# Patient Record
Sex: Female | Born: 1970 | Race: Black or African American | Hispanic: No | Marital: Single | Smoking: Current every day smoker
Health system: Southern US, Community
[De-identification: ages and names within clinical notes are randomized; demographics above are authoritative.]

## PROBLEM LIST (undated history)

## (undated) DIAGNOSIS — E119 Type 2 diabetes mellitus without complications: Secondary | ICD-10-CM

## (undated) HISTORY — PX: ABDOMINAL HYSTERECTOMY: SHX81

---

## 2003-06-22 DIAGNOSIS — D869 Sarcoidosis, unspecified: Secondary | ICD-10-CM | POA: Insufficient documentation

## 2006-08-10 ENCOUNTER — Emergency Department: Payer: Self-pay | Admitting: Emergency Medicine

## 2007-01-13 ENCOUNTER — Other Ambulatory Visit: Payer: Self-pay

## 2007-01-14 ENCOUNTER — Inpatient Hospital Stay: Payer: Self-pay | Admitting: Internal Medicine

## 2007-01-19 ENCOUNTER — Ambulatory Visit: Payer: Self-pay | Admitting: Vascular Surgery

## 2007-07-09 ENCOUNTER — Ambulatory Visit: Payer: Self-pay | Admitting: Internal Medicine

## 2007-07-12 ENCOUNTER — Ambulatory Visit: Payer: Self-pay | Admitting: Internal Medicine

## 2008-05-16 ENCOUNTER — Emergency Department: Payer: Self-pay | Admitting: Emergency Medicine

## 2008-10-29 ENCOUNTER — Ambulatory Visit: Payer: Self-pay | Admitting: Family Medicine

## 2009-09-17 ENCOUNTER — Emergency Department: Payer: Self-pay | Admitting: Emergency Medicine

## 2010-06-26 IMAGING — CR DG CHEST 1V PORT
1 series · 1 of 1 positions shown · non-contrast
Comparison: none

REASON FOR EXAM: chest pain
COMMENTS:

PROCEDURE:     DXR - DXR PORTABLE CHEST SINGLE VIEW  - September 17, 2009  [DATE]
RESULT:     The lungs are hypoinflated. The cardiac silhouette is at the
upper limits of normal for size. The pulmonary vascularity is not engorged.
There is no pleural effusion.

[view not recorded]
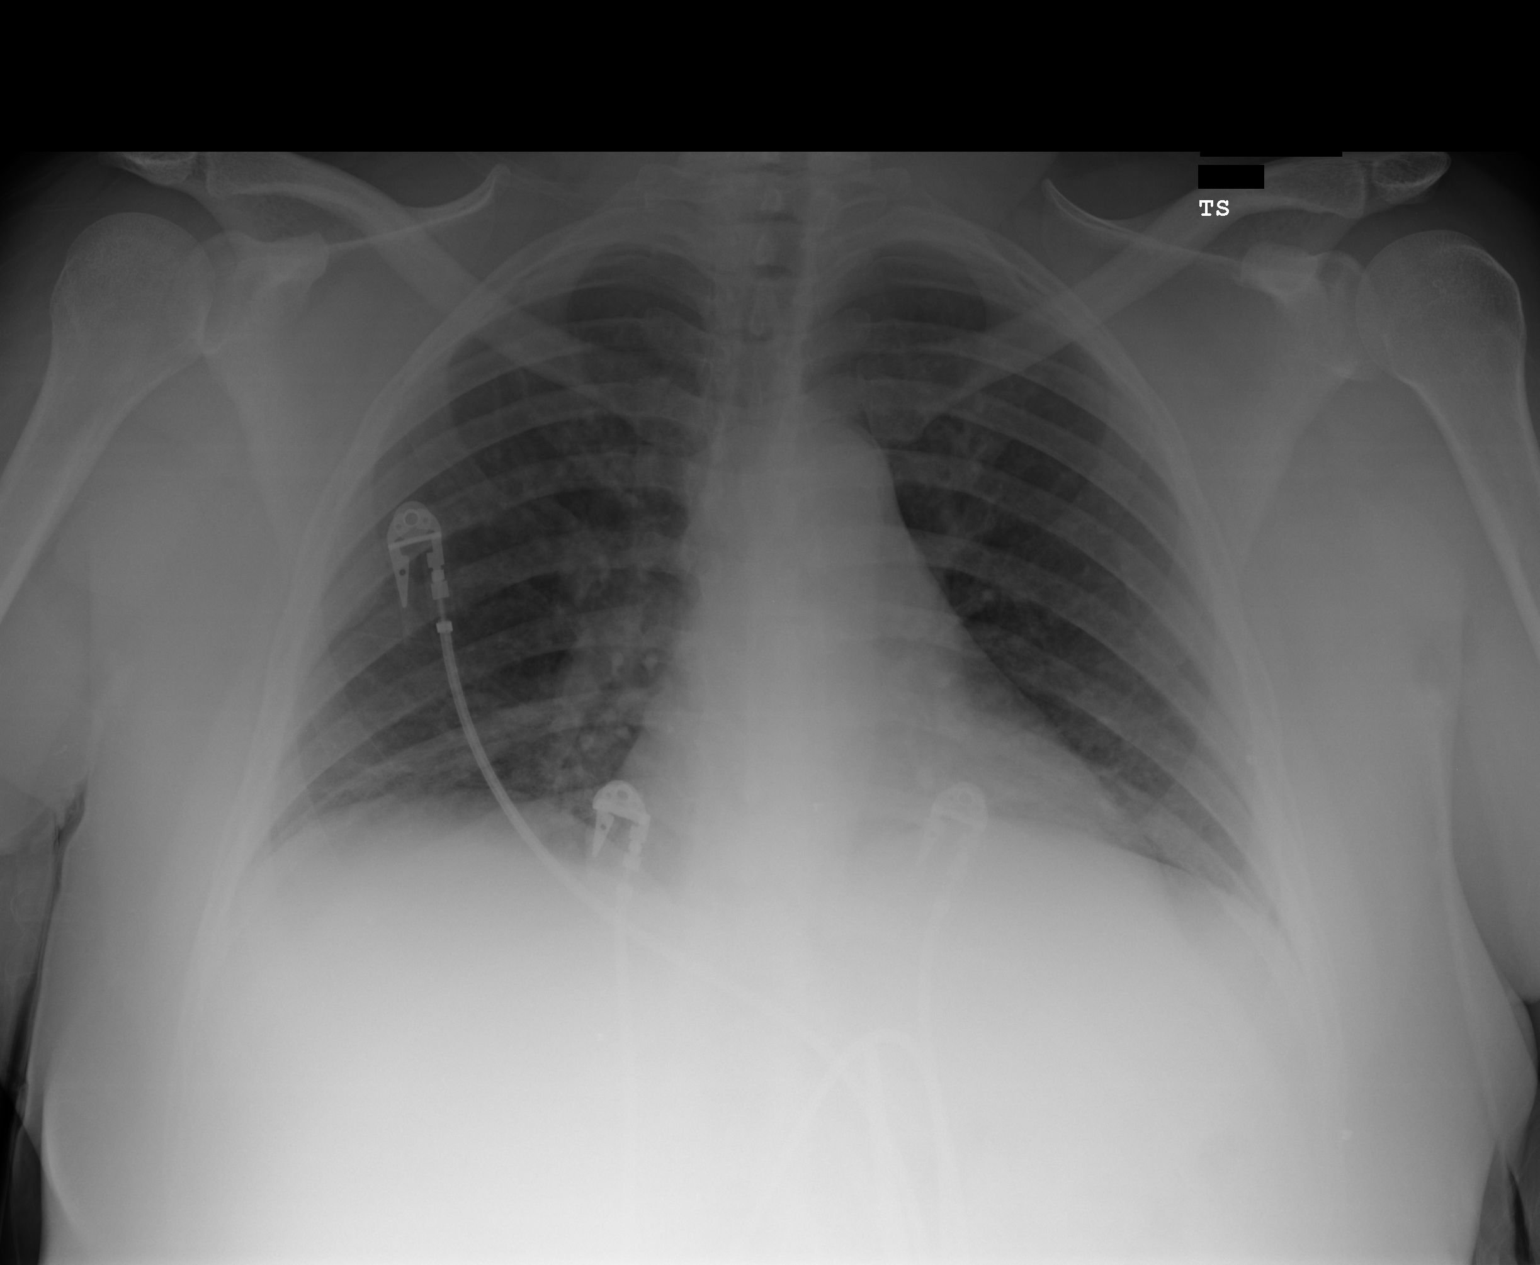

[1 of 1 positions shown; findings below may reference images not displayed]

IMPRESSION: There is mild pulmonary hypoinflation. I do not see
evidence of CHF nor of pneumonia.

## 2012-01-15 ENCOUNTER — Emergency Department: Payer: Self-pay | Admitting: Emergency Medicine

## 2012-01-17 LAB — BETA STREP CULTURE(ARMC)

## 2012-05-03 DIAGNOSIS — F17210 Nicotine dependence, cigarettes, uncomplicated: Secondary | ICD-10-CM | POA: Insufficient documentation

## 2012-05-03 DIAGNOSIS — E119 Type 2 diabetes mellitus without complications: Secondary | ICD-10-CM | POA: Insufficient documentation

## 2012-05-03 DIAGNOSIS — E1142 Type 2 diabetes mellitus with diabetic polyneuropathy: Secondary | ICD-10-CM | POA: Insufficient documentation

## 2012-05-03 DIAGNOSIS — K116 Mucocele of salivary gland: Secondary | ICD-10-CM | POA: Insufficient documentation

## 2012-05-03 DIAGNOSIS — I10 Essential (primary) hypertension: Secondary | ICD-10-CM | POA: Insufficient documentation

## 2012-05-04 DIAGNOSIS — E785 Hyperlipidemia, unspecified: Secondary | ICD-10-CM | POA: Insufficient documentation

## 2012-05-04 DIAGNOSIS — E1169 Type 2 diabetes mellitus with other specified complication: Secondary | ICD-10-CM | POA: Insufficient documentation

## 2012-06-22 DIAGNOSIS — K219 Gastro-esophageal reflux disease without esophagitis: Secondary | ICD-10-CM | POA: Insufficient documentation

## 2012-06-22 DIAGNOSIS — T50905A Adverse effect of unspecified drugs, medicaments and biological substances, initial encounter: Secondary | ICD-10-CM | POA: Insufficient documentation

## 2012-06-27 ENCOUNTER — Emergency Department: Payer: Self-pay | Admitting: Emergency Medicine

## 2012-06-27 LAB — COMPREHENSIVE METABOLIC PANEL
Alkaline Phosphatase: 201 U/L — ABNORMAL HIGH (ref 50–136)
Bilirubin,Total: 0.4 mg/dL (ref 0.2–1.0)
Calcium, Total: 9.1 mg/dL (ref 8.5–10.1)
Chloride: 105 mmol/L (ref 98–107)
Co2: 23 mmol/L (ref 21–32)
Creatinine: 0.95 mg/dL (ref 0.60–1.30)
EGFR (African American): >60
EGFR (Non-African Amer.): >60
Osmolality: 280 (ref 275–301)
SGPT (ALT): 136 U/L — ABNORMAL HIGH
Total Protein: 9 g/dL — ABNORMAL HIGH (ref 6.4–8.2)

## 2012-06-27 LAB — CBC
HCT: 41.3 % (ref 35.0–47.0)
MCH: 29.5 pg (ref 26.0–34.0)
MCHC: 32.8 g/dL (ref 32.0–36.0)
MCV: 90 fL (ref 80–100)
Platelet: 172 10*3/uL (ref 150–440)
RBC: 4.59 10*6/uL (ref 3.80–5.20)
RDW: 13.2 % (ref 11.5–14.5)

## 2012-06-27 LAB — TROPONIN I: Troponin-I: <0.02 ng/mL

## 2013-01-12 ENCOUNTER — Emergency Department: Payer: Self-pay | Admitting: Emergency Medicine

## 2015-03-12 ENCOUNTER — Emergency Department
Admit: 2015-03-12 | Disposition: A | Discharge status: Home / Self Care | Payer: Self-pay | Admitting: Emergency Medicine

## 2015-03-12 LAB — CBC WITH DIFFERENTIAL/PLATELET
BASOS ABS: 0.1 10*3/uL (ref 0.0–0.1)
Basophil %: 0.8 %
EOS PCT: 4.8 %
Eosinophil #: 0.5 10*3/uL (ref 0.0–0.7)
HCT: 41.9 % (ref 35.0–47.0)
HGB: 14.1 g/dL (ref 12.0–16.0)
LYMPHS ABS: 4.2 10*3/uL — AB (ref 1.0–3.6)
Lymphocyte %: 43.8 %
MCH: 30.2 pg (ref 26.0–34.0)
MCHC: 33.7 g/dL (ref 32.0–36.0)
MCV: 90 fL (ref 80–100)
Monocyte #: 0.7 x10 3/mm (ref 0.2–0.9)
Monocyte %: 7.6 %
NEUTROS PCT: 43 %
Neutrophil #: 4.1 10*3/uL (ref 1.4–6.5)
Platelet: 192 10*3/uL (ref 150–440)
RBC: 4.67 10*6/uL (ref 3.80–5.20)
RDW: 13.5 % (ref 11.5–14.5)
WBC: 9.6 10*3/uL (ref 3.6–11.0)

## 2015-03-12 LAB — COMPREHENSIVE METABOLIC PANEL
ALK PHOS: 113 U/L
Albumin: 4.3 g/dL
Anion Gap: 9 (ref 7–16)
BILIRUBIN TOTAL: 0.5 mg/dL
BUN: 18 mg/dL
CALCIUM: 9.8 mg/dL
CHLORIDE: 101 mmol/L
CO2: 26 mmol/L
CREATININE: 0.82 mg/dL
EGFR (African American): >60
EGFR (Non-African Amer.): >60
Glucose: 116 mg/dL — ABNORMAL HIGH
POTASSIUM: 3.7 mmol/L
SGOT(AST): 33 U/L
SGPT (ALT): 40 U/L
SODIUM: 136 mmol/L
Total Protein: 8.5 g/dL — ABNORMAL HIGH

## 2015-03-12 LAB — TROPONIN I

## 2015-05-08 DIAGNOSIS — Z720 Tobacco use: Secondary | ICD-10-CM | POA: Insufficient documentation

## 2019-11-08 ENCOUNTER — Emergency Department
Admission: EM | Admit: 2019-11-08 | Discharge: 2019-11-08 | Disposition: A | Discharge status: Home / Self Care | Payer: Self-pay | Attending: Emergency Medicine | Admitting: Emergency Medicine

## 2019-11-08 ENCOUNTER — Emergency Department: Payer: Self-pay

## 2019-11-08 ENCOUNTER — Encounter: Payer: Self-pay | Admitting: Emergency Medicine

## 2019-11-08 ENCOUNTER — Other Ambulatory Visit: Payer: Self-pay

## 2019-11-08 DIAGNOSIS — F17210 Nicotine dependence, cigarettes, uncomplicated: Secondary | ICD-10-CM | POA: Insufficient documentation

## 2019-11-08 DIAGNOSIS — R030 Elevated blood-pressure reading, without diagnosis of hypertension: Secondary | ICD-10-CM

## 2019-11-08 DIAGNOSIS — R7309 Other abnormal glucose: Secondary | ICD-10-CM

## 2019-11-08 DIAGNOSIS — E1165 Type 2 diabetes mellitus with hyperglycemia: Secondary | ICD-10-CM | POA: Insufficient documentation

## 2019-11-08 DIAGNOSIS — R21 Rash and other nonspecific skin eruption: Secondary | ICD-10-CM

## 2019-11-08 DIAGNOSIS — R590 Localized enlarged lymph nodes: Secondary | ICD-10-CM

## 2019-11-08 DIAGNOSIS — R609 Edema, unspecified: Secondary | ICD-10-CM

## 2019-11-08 HISTORY — DX: Type 2 diabetes mellitus without complications: E11.9

## 2019-11-08 LAB — CBC WITH DIFFERENTIAL/PLATELET
Abs Immature Granulocytes: 0.02 10*3/uL (ref 0.00–0.07)
Basophils Absolute: 0.1 10*3/uL (ref 0.0–0.1)
Basophils Relative: 1 %
Eosinophils Absolute: 0.4 10*3/uL (ref 0.0–0.5)
Eosinophils Relative: 6 %
HCT: 40.8 % (ref 36.0–46.0)
Hemoglobin: 14 g/dL (ref 12.0–15.0)
Immature Granulocytes: 0 %
Lymphocytes Relative: 42 %
Lymphs Abs: 2.4 10*3/uL (ref 0.7–4.0)
MCH: 29.4 pg (ref 26.0–34.0)
MCHC: 34.3 g/dL (ref 30.0–36.0)
MCV: 85.7 fL (ref 80.0–100.0)
Monocytes Absolute: 0.6 10*3/uL (ref 0.1–1.0)
Monocytes Relative: 10 %
Neutro Abs: 2.3 10*3/uL (ref 1.7–7.7)
Neutrophils Relative %: 41 %
Platelets: 173 10*3/uL (ref 150–400)
RBC: 4.76 MIL/uL (ref 3.87–5.11)
RDW: 12 % (ref 11.5–15.5)
WBC: 5.7 10*3/uL (ref 4.0–10.5)
nRBC: 0 % (ref 0.0–0.2)

## 2019-11-08 LAB — BASIC METABOLIC PANEL
Anion gap: 10 (ref 5–15)
BUN: 12 mg/dL (ref 6–20)
CO2: 24 mmol/L (ref 22–32)
Calcium: 9.6 mg/dL (ref 8.9–10.3)
Chloride: 103 mmol/L (ref 98–111)
Creatinine, Ser: 0.87 mg/dL (ref 0.44–1.00)
GFR calc Af Amer: >60 mL/min (ref >60)
GFR calc non Af Amer: >60 mL/min (ref >60)
Glucose, Bld: 197 mg/dL — ABNORMAL HIGH (ref 70–99)
Potassium: 4.7 mmol/L (ref 3.5–5.1)
Sodium: 137 mmol/L (ref 135–145)

## 2019-11-08 LAB — GLUCOSE, CAPILLARY: Glucose-Capillary: 243 mg/dL — ABNORMAL HIGH (ref 70–99)

## 2019-11-08 MED ORDER — IOHEXOL 300 MG/ML  SOLN
75.0000 mL | Freq: Once | INTRAMUSCULAR | Status: AC | PRN
  Administered 2019-11-08: 75 mL via INTRAVENOUS
  Filled 2019-11-08: qty 75

## 2019-11-08 MED ORDER — METHYLPREDNISOLONE 4 MG PO TBPK: ORAL_TABLET | ORAL | 0 refills | Status: DC

## 2019-11-08 MED ORDER — HYDROCORTISONE VALERATE 0.2 % EX OINT: TOPICAL_OINTMENT | CUTANEOUS | 1 refills | Status: AC

## 2019-11-08 MED ORDER — HYDROXYZINE HCL 50 MG PO TABS
50.0000 mg | ORAL_TABLET | Freq: Once | ORAL | Status: AC
  Administered 2019-11-08: 50 mg via ORAL
  Filled 2019-11-08: qty 1

## 2019-11-08 MED ORDER — HYDROXYZINE HCL 50 MG PO TABS: 50.0000 mg | ORAL_TABLET | Freq: Three times a day (TID) | ORAL | 0 refills | Status: DC | PRN

## 2019-11-08 NOTE — ED Provider Notes (Signed)
[facility] Emergency Department Provider Note   ____________________________________________   First MD Initiated Contact with Patient 11/08/19 1045     (approximate)  I have reviewed the triage vital signs and the nursing notes.   HISTORY  Chief Complaint Rash    HPI Molly Salinas is a 48 y.o. female patient presents with facial rash which started last night.  Patient state rash started on headache  And it spread to the nasal area.  Rash was itching but is now under control after taking 25 mg of Benadryl approximately 2 hours ago.  Patient also has right peri-auricle edema upon a.m. awakening.  Patient stated area is tender to palpation.  Patient rates the pain as a 5/10.  Patient described the pain as "sore".  Patient denies fever/ chills associated with complaint.  Patient denies URI signs and symptoms.  Patient denies hearing loss.     Past Medical History:  Diagnosis Date  . Diabetes mellitus without complication (HCC)     There are no active problems to display for this patient.   Past Surgical History:  Procedure Laterality Date  . ABDOMINAL HYSTERECTOMY      Prior to Admission medications   Medication Sig Start Date End Date Taking? Authorizing Provider  hydrocortisone valerate ointment (WESTCORT) 0.2 % Apply to affected area daily 11/08/19 11/07/20  Joni ReiningSmith, Ronald K, PA-C  hydrOXYzine (ATARAX/VISTARIL) 50 MG tablet Take 1 tablet (50 mg total) by mouth 3 (three) times daily as needed for itching. 11/08/19   Joni ReiningSmith, Ronald K, PA-C  methylPREDNISolone (MEDROL DOSEPAK) 4 MG TBPK tablet Take Tapered dose as directed 11/08/19   Joni ReiningSmith, Ronald K, PA-C    Allergies Patient has no known allergies.  No family history on file.  Social History Social History   Tobacco Use  . Smoking status: Current Every Day Smoker  . Smokeless tobacco: Never Used  Substance Use Topics  . Alcohol use: Not on file  . Drug use: Not on file    Review  of Systems Constitutional: No fever/chills Eyes: No visual changes. ENT: No sore throat. Cardiovascular: Denies chest pain. Respiratory: Denies shortness of breath. Gastrointestinal: No abdominal pain.  No nausea, no vomiting.  No diarrhea.  No constipation. Genitourinary: Negative for dysuria. Musculoskeletal: Negative for back pain. Skin: Negative for rash. Neurological: Negative for headaches, focal weakness or numbness. Endocrine:  Diabetes and hypertension.   ____________________________________________   PHYSICAL EXAM:  VITAL SIGNS: ED Triage Vitals  Enc Vitals Group     BP 11/08/19 1050 (!) 170/98     Pulse Rate 11/08/19 1050 78     Resp 11/08/19 1050 18     Temp 11/08/19 1050 98.6 F (37 C)     Temp Source 11/08/19 1050 Oral     SpO2 11/08/19 1050 100 %     Weight 11/08/19 1051 180 lb (81.6 kg)     Height 11/08/19 1051 5\' 4"  (1.626 m)     Head Circumference --      Peak Flow --      Pain Score 11/08/19 1051 5     Pain Loc --      Pain Edu? --      Excl. in GC? --    Constitutional: Alert and oriented. Well appearing and in no acute distress. Eyes: Conjunctivae are normal. PERRL. EOMI. Head: Atraumatic. Nose: No congestion/rhinnorhea. Mouth/Throat: Mucous membranes are moist.  Oropharynx non-erythematous. Neck: No stridor.   Hematological/Lymphatic/Immunilogical: Submandibular/parotid edema. Cardiovascular: Normal rate, regular rhythm. Grossly  normal heart sounds.  Good peripheral circulation. Respiratory: Normal respiratory effort.  No retractions. Lungs CTAB. Gastrointestinal: Soft and nontender. No distention. No abdominal bruits. No CVA tenderness. Neurologic:  Normal speech and language. No gross focal neurologic deficits are appreciated. No gait instability. Skin:  Skin is warm, dry and intact.  Papulovesicular lesions central forehead. Psychiatric: Mood and affect are normal. Speech and behavior are normal.   ____________________________________________   LABS (all labs ordered are listed, but only abnormal results are displayed)  Labs Reviewed  GLUCOSE, CAPILLARY - Abnormal; Notable for the following components:      Result Value   Glucose-Capillary 243 (*)    All other components within normal limits  BASIC METABOLIC PANEL - Abnormal; Notable for the following components:   Glucose, Bld 197 (*)    All other components within normal limits  CBC WITH DIFFERENTIAL/PLATELET  CBG MONITORING, ED   ____________________________________________  EKG   ____________________________________________  RADIOLOGY  ED MD interpretation:    Official radiology report(s): Ct Soft Tissue Neck W Contrast  Result Date: 11/08/2019 CLINICAL DATA:  Facial rash. Forehead swelling. EXAM: CT NECK WITH CONTRAST TECHNIQUE: Multidetector CT imaging of the neck was performed using the standard protocol following the bolus administration of intravenous contrast. CONTRAST:  63mL OMNIPAQUE IOHEXOL 300 MG/ML  SOLN COMPARISON:  Neck ultrasound 11/08/2019 FINDINGS: Pharynx and larynx: No evidence of mass or swelling. Patent airway. No fluid collection or inflammatory changes in the parapharyngeal or retropharyngeal spaces. Medial positioning of the left vocal cord with asymmetric enlargement of the left laryngeal ventricle and left piriform sinus. Salivary glands: Small soft tissue nodules within and adjacent to the parotid glands bilaterally, right greater than left and favored to reflect intraparotid and periparotid lymph nodes including superior intraparotid nodes measuring up to 12 x 8 mm on the right and 8 x 6 mm on the left. 16 x 10 mm periparotid lymph node along the posterior margin of the right parotid gland inferiorly. Symmetric prominent size of the parotid and submandibular glands bilaterally without parotid or submandibular space inflammation, salivary calculi, or ductal dilatation. Thyroid: Unremarkable. Lymph  nodes: Enlarged right level IIA lymph nodes measuring 1.3 and 1.4 cm in short axis. Abnormally increased number of rounded subcentimeter short axis lymph nodes in levels II and III bilaterally as well as on the right and level V. no cystic changes. Vascular: Major vascular structures of the neck are patent. Limited intracranial: Unremarkable. Visualized orbits: Unremarkable. Mastoids and visualized paranasal sinuses: Mild-to-moderate mucosal thickening involving the right sphenoid sinus and posterior right ethmoid air cells. No sinus fluid. Small left mastoid effusion. Skeleton: Bulky anterior vertebral ossification from C4-C7 compatible with diffuse idiopathic skeletal hyperostosis. Moderate facet arthrosis on the left at C2-3 and on the right at C7-T1. No suspicious osseous lesion. Upper chest: Clear lung apices. No superior mediastinal lymphadenopathy. Other: None. IMPRESSION: 1. Mild right greater than left cervical lymphadenopathy including mildly enlarged intraparotid/periparotid lymph nodes bilaterally. The appearance is nonspecific, and while a reactive/inflammatory etiology is favored, close clinical follow-up is recommended as lymphoproliferative disease is not excluded. 2. No primary neck mass identified. 3. Possible left vocal cord paresis. Electronically Signed   By: Logan Bores M.D.   On: 11/08/2019 14:41   US Soft Tissue Head & Neck (non-thyroid)  Result Date: 11/08/2019 CLINICAL DATA:  Edema. Additional history provided by scanning technologist, right periauricular edema for 1 day, recent face rash, not on antibiotics, area of swelling over right parotid. EXAM: ULTRASOUND OF HEAD/NECK SOFT TISSUES  TECHNIQUE: Ultrasound examination of the bilateral neck soft tissues was performed in the area of clinical concern. COMPARISON:  No pertinent prior studies available for comparison. FINDINGS: Multiple prominent bilateral neck lymph nodes are demonstrated. Some of these lymph nodes demonstrate a  thickened cortex and some nodes have a poorly delineated fatty hilum. Most notably, there is an enlarged lymph node within the right submandibular region measuring 2.2 x 1.1 x 0.8 cm. IMPRESSION: Prominent lymph nodes within the bilateral neck, some of which demonstrate a thickened cortex and some with a poorly delineated fatty hilum. Most notably, there is an enlarged right submandibular node measuring 2.2 x 1.1 x 0.8 cm. Findings are nonspecific and may be secondary to an infectious/reactive or neoplastic etiology. Consider contrast-enhanced neck CT for further evaluation. Electronically Signed   By: Jackey Loge DO   On: 11/08/2019 12:58    ____________________________________________   PROCEDURES  Procedure(s) performed (including Critical Care):  Procedures   ____________________________________________   INITIAL IMPRESSION / ASSESSMENT AND PLAN / ED COURSE  As part of my medical decision making, I reviewed the following data within the electronic MEDICAL RECORD NUMBER         Patient presents with facial rash but more importantly  Right parotid/submandibular swelling.  For examination rash consistent with contact dermatitis.  Further evaluation of the submandibular parotid edema with ultrasounds and CT reveals lymphadenopathy.  Patient given discharge care instructions and advised if no improvement in 5 days to follow with ENT clinic.  We advise antibiotic may cause some drowsiness.      ____________________________________________   FINAL CLINICAL IMPRESSION(S) / ED DIAGNOSES  Final diagnoses:  Edema  Postauricular adenopathy  Rash and nonspecific skin eruption  Elevated blood pressure reading  Elevated glucose     ED Discharge Orders         Ordered    methylPREDNISolone (MEDROL DOSEPAK) 4 MG TBPK tablet     11/08/19 1500    hydrOXYzine (ATARAX/VISTARIL) 50 MG tablet  3 times daily PRN     11/08/19 1500    hydrocortisone valerate ointment (WESTCORT) 0.2 %      11/08/19 1500           Note:  This document was prepared using Dragon voice recognition software and may include unintentional dictation errors.    Joni Reining, PA-C 11/08/19 1507    Minna Antis, MD 11/08/19 1536

## 2019-11-08 NOTE — ED Triage Notes (Signed)
Presents with rash to face    Area of fine rash noted to hair line  Then woke up with large area of swelling noted to forehead

## 2019-11-08 NOTE — Discharge Instructions (Addendum)
If no improvement in the swollen the right mandible area in 5 days call the ENT clinic listed in your discharge care instructions.  Take medication as directed and monitor blood sugar secondary to your diabetic condition.  Also advised you to ask reestablish care with your PCP for reevaluation of elevated blood pressure and blood glucose.

## 2023-04-13 ENCOUNTER — Ambulatory Visit
Admission: EM | Admit: 2023-04-13 | Discharge: 2023-04-13 | Disposition: A | Discharge status: Home / Self Care | Payer: Self-pay | Attending: Emergency Medicine | Admitting: Emergency Medicine

## 2023-04-13 ENCOUNTER — Other Ambulatory Visit: Payer: Self-pay

## 2023-04-13 DIAGNOSIS — M5441 Lumbago with sciatica, right side: Secondary | ICD-10-CM

## 2023-04-13 DIAGNOSIS — M546 Pain in thoracic spine: Secondary | ICD-10-CM

## 2023-04-13 MED ORDER — IBUPROFEN 600 MG PO TABS: 600.0000 mg | ORAL_TABLET | Freq: Four times a day (QID) | ORAL | 0 refills | Status: DC | PRN

## 2023-04-13 MED ORDER — BACLOFEN 10 MG PO TABS: 10.0000 mg | ORAL_TABLET | Freq: Three times a day (TID) | ORAL | 0 refills | Status: DC

## 2023-04-13 NOTE — ED Triage Notes (Addendum)
Woke up with left lower back pain on Friday and has had on relief from OTC meds. Denies injury

## 2023-04-13 NOTE — Discharge Instructions (Signed)
Take the ibuprofen, 600 mg every 6 hours with food, on a schedule for the next 48 hours and then as needed.  Take the baclofen, 10 mg every 8 hours, on a schedule for the next 48 hours and then as needed.  Apply moist heat to your back for 30 minutes at a time 2-3 times a day to improve blood flow to the area and help remove the lactic acid causing the spasm.  Follow the back exercises given at discharge.  Return for reevaluation for any new or worsening symptoms.  

## 2023-04-13 NOTE — ED Provider Notes (Signed)
[facility]    CSN: [id number] Arrival date & time: 04/13/23  1135      History   Chief Complaint Chief Complaint  Patient presents with   Back Pain    HPI Molly Salinas is a 52 y.o. female.   HPI  52 year old female with a past medical history significant for diabetes and abdominal hysterectomy presents for evaluation of 4 days worth of left low back pain.  Patient reports that she woke up with the pain and she denies any injury.  She has been using over-the-counter pain medication with some relief of symptoms.  Past Medical History:  Diagnosis Date   Diabetes mellitus without complication (HCC)     There are no problems to display for this patient.   Past Surgical History:  Procedure Laterality Date   ABDOMINAL HYSTERECTOMY      OB History   No obstetric history on file.      Home Medications    Prior to Admission medications   Medication Sig Start Date End Date Taking? Authorizing Provider  baclofen (LIORESAL) 10 MG tablet Take 1 tablet (10 mg total) by mouth 3 (three) times daily. 04/13/23  Yes Becky Augusta, NP  ibuprofen (ADVIL) 600 MG tablet Take 1 tablet (600 mg total) by mouth every 6 (six) hours as needed. 04/13/23  Yes Becky Augusta, NP    Family History History reviewed. No pertinent family history.  Social History Social History   Tobacco Use   Smoking status: Every Day   Smokeless tobacco: Never  Substance Use Topics   Alcohol use: Yes    Alcohol/week: 2.0 standard drinks of alcohol    Types: 2 Glasses of wine per week    Comment: Once per week   Drug use: Never     Allergies   Patient has no known allergies.   Review of Systems Review of Systems  Musculoskeletal:  Positive for back pain.  Neurological:  Negative for weakness and numbness.     Physical Exam Triage Vital Signs ED Triage Vitals  Enc Vitals Group     BP 04/13/23 1241 (!) 172/105     Pulse Rate 04/13/23 1241 89     Resp 04/13/23 1241 20      Temp 04/13/23 1241 98.3 F (36.8 C)     Temp src --      SpO2 04/13/23 1241 100 %     Weight --      Height --      Head Circumference --      Peak Flow --      Pain Score 04/13/23 1239 8     Pain Loc --      Pain Edu? --      Excl. in GC? --    No data found.  Updated Vital Signs BP (!) 172/105   Pulse 89   Temp 98.3 F (36.8 C)   Resp 20   SpO2 100%   Visual Acuity Right Eye Distance:   Left Eye Distance:   Bilateral Distance:    Right Eye Near:   Left Eye Near:    Bilateral Near:     Physical Exam Vitals and nursing note reviewed.  Constitutional:      Appearance: Normal appearance.  Musculoskeletal:        General: Tenderness present. No swelling, deformity or signs of injury.  Skin:    General: Skin is warm and dry.     Capillary Refill: Capillary refill takes less than 2  seconds.  Neurological:     General: No focal deficit present.     Mental Status: She is alert and oriented to person, place, and time.      UC Treatments / Results  Labs (all labs ordered are listed, but only abnormal results are displayed) Labs Reviewed - No data to display  EKG   Radiology No results found.  Procedures Procedures (including critical care time)  Medications Ordered in UC Medications - No data to display  Initial Impression / Assessment and Plan / UC Course  I have reviewed the triage vital signs and the nursing notes.  Pertinent labs & imaging results that were available during my care of the patient were reviewed by me and considered in my medical decision making (see chart for details).   Patient is a pleasant, nontoxic-appearing 52 year old female here for evaluation of pain left side of her low back that started 4 days ago.  This not associated with any injury or heavy lifting.  She states that she had had pain on the right side of her back previously that was improved when she switched her mattress.  This was 6 months ago.  She is unsure what provoked  her current back pain on the left side.  The pain is better when she is standing and walking and worse after she has been sitting or laying.  It has been responding to over-the-counter ibuprofen and Tylenol.  Sam she has no midline spinous process tenderness or step-off in her thoracic or lumbar region but she does have muscle tension and tenderness in both the left thoracic and lumbar paraspinous regions.  Her right paraspinous regions and free of tenderness.  She does have a positive straight leg raise on the left-hand side.  Will treat her for thoracic and lumbar back pain with sciatica with a combination of ibuprofen 600 mg every 6 hours and baclofen 10 mg every 8 hours.  Also home physical therapy and moist heat.  Return precautions reviewed.  Patient denies need for work note.   Final Clinical Impressions(s) / UC Diagnoses   Final diagnoses:  Acute left-sided low back pain with right-sided sciatica  Acute left-sided thoracic back pain     Discharge Instructions      Take the ibuprofen, 600 mg every 6 hours with food, on a schedule for the next 48 hours and then as needed.  Take the baclofen, 10 mg every 8 hours, on a schedule for the next 48 hours and then as needed.  Apply moist heat to your back for 30 minutes at a time 2-3 times a day to improve blood flow to the area and help remove the lactic acid causing the spasm.  Follow the back exercises given at discharge.  Return for reevaluation for any new or worsening symptoms.      ED Prescriptions     Medication Sig Dispense Auth. Provider   baclofen (LIORESAL) 10 MG tablet Take 1 tablet (10 mg total) by mouth 3 (three) times daily. 30 each Becky Augusta, NP   ibuprofen (ADVIL) 600 MG tablet Take 1 tablet (600 mg total) by mouth every 6 (six) hours as needed. 30 tablet Becky Augusta, NP      PDMP not reviewed this encounter.   Becky Augusta, NP 04/13/23 1319

## 2023-11-01 ENCOUNTER — Telehealth: Payer: Self-pay | Admitting: Emergency Medicine

## 2023-11-01 ENCOUNTER — Ambulatory Visit
Admission: EM | Admit: 2023-11-01 | Discharge: 2023-11-01 | Disposition: A | Discharge status: Home / Self Care | Payer: Self-pay | Attending: Emergency Medicine | Admitting: Emergency Medicine

## 2023-11-01 ENCOUNTER — Encounter: Payer: Self-pay | Admitting: Emergency Medicine

## 2023-11-01 DIAGNOSIS — R109 Unspecified abdominal pain: Secondary | ICD-10-CM | POA: Insufficient documentation

## 2023-11-01 DIAGNOSIS — M546 Pain in thoracic spine: Secondary | ICD-10-CM | POA: Insufficient documentation

## 2023-11-01 LAB — URINALYSIS, W/ REFLEX TO CULTURE (INFECTION SUSPECTED)
Bilirubin Urine: NEGATIVE
Glucose, UA: >=500 mg/dL — AB
Hgb urine dipstick: NEGATIVE
Ketones, ur: NEGATIVE mg/dL
Leukocytes,Ua: NEGATIVE
Nitrite: NEGATIVE
Protein, ur: 30 mg/dL — AB
RBC / HPF: NONE SEEN RBC/hpf (ref 0–5)
Specific Gravity, Urine: 1.02 (ref 1.005–1.030)
pH: 6 (ref 5.0–8.0)

## 2023-11-01 MED ORDER — PREDNISONE 10 MG (21) PO TBPK: ORAL_TABLET | Freq: Every day | ORAL | 0 refills | Status: DC

## 2023-11-01 MED ORDER — CYCLOBENZAPRINE HCL 10 MG PO TABS: 10.0000 mg | ORAL_TABLET | Freq: Every day | ORAL | 0 refills | Status: DC

## 2023-11-01 MED ORDER — KETOROLAC TROMETHAMINE 30 MG/ML IJ SOLN
30.0000 mg | Freq: Once | INTRAMUSCULAR | Status: AC
  Administered 2023-11-01: 30 mg via INTRAMUSCULAR

## 2023-11-01 NOTE — ED Provider Notes (Signed)
[facility]    CSN: [id number] Arrival date & time: 11/01/23  1318      History   Chief Complaint Chief Complaint  Patient presents with   Flank Pain    HPI Molly Salinas is a 52 y.o. female.   Patient presents for evaluation of left-sided flank pain wrapping to the mid back present for 1 month.  Symptoms have been constant described as a pressure.  Has attempted use of Tylenol and ibuprofen which has not provided relief.  Was taking prednisone for another issue and endorses some improvement during that timeframe.  History of type 2 diabetes typically controlled by diet however endorses sugars have been elevated over the past few weeks and she has had urinary frequency, unsure if related.  Denies urgency, hematuria, abdominal pain, fevers.  Denies injury or trauma, pushing pulling or lifting.   Past Medical History:  Diagnosis Date   Diabetes mellitus without complication (HCC)     There are no problems to display for this patient.   Past Surgical History:  Procedure Laterality Date   ABDOMINAL HYSTERECTOMY      OB History   No obstetric history on file.      Home Medications    Prior to Admission medications   Medication Sig Start Date End Date Taking? Authorizing Provider  baclofen (LIORESAL) 10 MG tablet Take 1 tablet (10 mg total) by mouth 3 (three) times daily. 04/13/23   Becky Augusta, NP  ibuprofen (ADVIL) 600 MG tablet Take 1 tablet (600 mg total) by mouth every 6 (six) hours as needed. 04/13/23   Becky Augusta, NP    Family History No family history on file.  Social History Social History   Tobacco Use   Smoking status: Every Day   Smokeless tobacco: Never  Vaping Use   Vaping status: Never Used  Substance Use Topics   Alcohol use: Yes    Alcohol/week: 2.0 standard drinks of alcohol    Types: 2 Glasses of wine per week    Comment: Once per week   Drug use: Never     Allergies   Patient has no known allergies.   Review of  Systems Review of Systems   Physical Exam Triage Vital Signs ED Triage Vitals  Encounter Vitals Group     BP 11/01/23 1337 (!) 157/114     Systolic BP Percentile --      Diastolic BP Percentile --      Pulse Rate 11/01/23 1337 96     Resp 11/01/23 1337 18     Temp 11/01/23 1337 98.4 F (36.9 C)     Temp Source 11/01/23 1337 Oral     SpO2 11/01/23 1337 96 %     Weight --      Height --      Head Circumference --      Peak Flow --      Pain Score 11/01/23 1335 6     Pain Loc --      Pain Education --      Exclude from Growth Chart --    No data found.  Updated Vital Signs BP (!) 157/114 (BP Location: Right Arm)   Pulse 96   Temp 98.4 F (36.9 C) (Oral)   Resp 18   SpO2 96%   Visual Acuity Right Eye Distance:   Left Eye Distance:   Bilateral Distance:    Right Eye Near:   Left Eye Near:    Bilateral Near:  Physical Exam Constitutional:      Appearance: Normal appearance.  HENT:     Head: Normocephalic.  Eyes:     Extraocular Movements: Extraocular movements intact.  Pulmonary:     Effort: Pulmonary effort is normal.  Abdominal:     Tenderness: There is no right CVA tenderness.  Musculoskeletal:     Comments: Tenderness present to the mid thoracic region on the back without obvious deformities, tenderness present to the left flank approximately over ribs 6 through 8 without ecchymosis swelling or deformity, able to twist turn and bend but pain is elicited with twisting, able to sit erect without complication, no CVA tenderness noted  Neurological:     Mental Status: She is alert and oriented to person, place, and time. Mental status is at baseline.      UC Treatments / Results  Labs (all labs ordered are listed, but only abnormal results are displayed) Labs Reviewed - No data to display  EKG   Radiology No results found.  Procedures Procedures (including critical care time)  Medications Ordered in UC Medications - No data to  display  Initial Impression / Assessment and Plan / UC Course  I have reviewed the triage vital signs and the nursing notes.  Pertinent labs & imaging results that were available during my care of the patient were reviewed by me and considered in my medical decision making (see chart for details).  Acute left-sided thoracic back pain, left flank pain  Etiology most likely muscular low suspicion for infection due to timeline of illness but will rule out, urinalysis negative, reported results via telephone, within urine is greater than 500, discussed findings, most likely related to frequent use of steroids over the past month, advised patient to monitor closely especially after discontinuation of current course of medicine, to follow-up with PCP if it still remains elevated, Toradol IM given and prescribed prednisone and Flexeril for outpatient use, recommended RICE, heat massage stretching with activity as tolerated, advised orthopedic follow-up if symptoms continue to persist, patient begins to to become symptomatic and blood sugars elevated she is to discontinue medication and go to the nearest emergency department for immediate evaluation, verbalized understanding Final Clinical Impressions(s) / UC Diagnoses   Final diagnoses:  None   Discharge Instructions   None    ED Prescriptions   None    PDMP not reviewed this encounter.   Valinda Hoar, NP 11/01/23 1443

## 2023-11-01 NOTE — Telephone Encounter (Signed)
Reported urinalysis results via telephone to patient, 2 patient identifiers used, urinalysis is negative for infection however there are greater than 504 glucose, history of diabetes controlled with diet, has been using steroids intermittently over the past month patient endorses that blood sugars have been elevated in the 400s, this is most likely due to medication use, discussed that if she becomes symptomatic with elevation she is to seek in person evaluation , advised once course of steroids have been completed that she is to monitor her blood sugars very closely and if they continue to be elevated she is to follow-up with her PCP for reevaluation and further management

## 2023-11-01 NOTE — ED Triage Notes (Signed)
Pt presents with left side pain x 1 month. She describes it as a sharp pain. Pt has taken ibuprofen and tylenol for the pain. She was taking prednisone for another issue 2 weeks ago and that helped with the pain.

## 2023-11-01 NOTE — Discharge Instructions (Addendum)
Today you are evaluated for your flank pain which I believe is muscular based on the location  Urinalysis is pending to rule out infection, you will be notified of results via telephone and if infection is noted you will receive an antibiotic  Injection of Toradol which helps with inflammation and pain, ideally will see improvement within the next hour  In the meantime, starting tomorrow begin prednisone every morning as directed, take with food, helps reduce inflammation and helps with pain, may use Tylenol or any topical medications while using this medicine  You may use muscle relaxant at bedtime to allow for comfort, this will make you drowsy  Prednisone will cause blood sugars to be elevated, if monitoring please mark down that you are on steroids during this timeframe  You may use heat over the affected area in 10 to 15-minute intervals  You may massage and stretch the affected area as tolerated  If your symptoms continue to persist you may follow-up with urgent care or your primary doctor for reevaluation as

## 2024-02-03 ENCOUNTER — Encounter: Payer: Self-pay | Admitting: *Deleted

## 2024-02-03 ENCOUNTER — Emergency Department
Admission: EM | Admit: 2024-02-03 | Discharge: 2024-02-04 | Disposition: A | Discharge status: Home / Self Care | Payer: Self-pay | Attending: Emergency Medicine | Admitting: Emergency Medicine

## 2024-02-03 ENCOUNTER — Other Ambulatory Visit: Payer: Self-pay

## 2024-02-03 DIAGNOSIS — L0291 Cutaneous abscess, unspecified: Secondary | ICD-10-CM

## 2024-02-03 DIAGNOSIS — L02414 Cutaneous abscess of left upper limb: Secondary | ICD-10-CM | POA: Diagnosis present

## 2024-02-03 MED ORDER — DOXYCYCLINE HYCLATE 50 MG PO CAPS: 100.0000 mg | ORAL_CAPSULE | Freq: Two times a day (BID) | ORAL | 0 refills | Status: AC

## 2024-02-03 MED ORDER — IBUPROFEN 600 MG PO TABS: 600.0000 mg | ORAL_TABLET | Freq: Three times a day (TID) | ORAL | 0 refills | Status: AC | PRN

## 2024-02-03 MED ORDER — HYDROCODONE-ACETAMINOPHEN 5-325 MG PO TABS
1.0000 | ORAL_TABLET | Freq: Once | ORAL | Status: AC
  Administered 2024-02-03: 1 via ORAL
  Filled 2024-02-03: qty 1

## 2024-02-03 MED ORDER — LIDOCAINE HCL (PF) 1 % IJ SOLN
5.0000 mL | Freq: Once | INTRAMUSCULAR | Status: AC
  Administered 2024-02-03: 5 mL
  Filled 2024-02-03: qty 5

## 2024-02-03 NOTE — Discharge Instructions (Addendum)
You have been diagnosed with left groin abscess.  Please take doxycycline 2 capsules by mouth 2 times daily for 5 days, please take ibuprofen 1 tablet by mouth every 8 hours as needed for pain.  Please come back to ED or go to your PCP if you have new symptoms and symptoms worsen.

## 2024-02-03 NOTE — ED Triage Notes (Addendum)
Pt ambulatory to triage.  Pt has 3 boils in left groin area x 5 days per pt.  No drainage.  Pt is diabetic   pt alert.  Pt out of bp meds for 6 months.

## 2024-02-03 NOTE — ED Provider Notes (Cosign Needed Addendum)
[facility] Provider Note    Event Date/Time   First MD Initiated Contact with Patient 02/03/24 2234     (approximate)   History   Abscess   HPI  Molly Salinas is a 53 y.o. female who presents today with history of 5 days of left inguinal abscess.  Patient states in the last 3 days patient was unable to walk due to pain.      Physical Exam   Triage Vital Signs: ED Triage Vitals  Encounter Vitals Group     BP 02/03/24 2220 (!) 212/117     Systolic BP Percentile --      Diastolic BP Percentile --      Pulse Rate 02/03/24 2220 (!) 120     Resp 02/03/24 2220 20     Temp 02/03/24 2220 98.5 F (36.9 C)     Temp Source 02/03/24 2220 Oral     SpO2 02/03/24 2220 99 %     Weight 02/03/24 2218 171 lb (77.6 kg)     Height 02/03/24 2218 5\' 4"  (1.626 m)     Head Circumference --      Peak Flow --      Pain Score 02/03/24 2218 10     Pain Loc --      Pain Education --      Exclude from Growth Chart --     Most recent vital signs: Vitals:   02/03/24 2220  BP: (!) 212/117  Pulse: (!) 120  Resp: 20  Temp: 98.5 F (36.9 C)  SpO2: 99%     Constitutional: Alert, mild distress Eyes: Conjunctivae are normal.  Head: Atraumatic. Nose: No congestion/rhinnorhea. Mouth/Throat: Mucous membranes are moist.   Neck: Painless ROM.  Cardiovascular:   Good peripheral circulation. Respiratory: Normal respiratory effort.  No retractions.  Gastrointestinal: Soft and nontender.  Musculoskeletal:  no deformity Neurologic:  MAE spontaneously. No gross focal neurologic deficits are appreciated.  Skin:  Skin is warm, dry and intact. No rash noted. Left groin: Presence of abscess about 3 cm, very tender to palpation Psychiatric: Mood and affect are normal. Speech and behavior are normal.    ED Results / Procedures / Treatments   Labs (all labs ordered are listed, but only abnormal results are displayed) Labs Reviewed - No data to  display   EKG     RADIOLOGY     PROCEDURES:  Critical Care performed:   .Incision and Drainage  Date/Time: 02/03/2024 11:50 PM  Performed by: Gladys Damme, PA-C Authorized by: Gladys Damme, PA-C   Consent:    Consent obtained:  Verbal   Consent given by:  Patient   Risks, benefits, and alternatives were discussed: yes     Risks discussed:  Bleeding and incomplete drainage Universal protocol:    Procedure explained and questions answered to patient or proxy's satisfaction: yes     Patient identity confirmed:  Verbally with patient Location:    Type:  Abscess   Location:  Lower extremity   Lower extremity location:  Leg   Leg location:  L upper leg Pre-procedure details:    Skin preparation:  Povidone-iodine Sedation:    Sedation type:  None Anesthesia:    Anesthesia method:  Local infiltration   Local anesthetic:  Lidocaine 1% WITH epi Procedure type:    Complexity:  Simple Procedure details:    Ultrasound guidance: no     Needle aspiration: yes     Needle size:  20 G   Incision  types:  Single straight   Incision depth:  Subcutaneous   Drainage:  Purulent   Drainage amount:  Copious   Wound treatment:  Wound left open   Packing materials:  None Post-procedure details:    Procedure completion:  Tolerated well, no immediate complications    MEDICATIONS ORDERED IN ED: Medications  lidocaine (PF) (XYLOCAINE) 1 % injection 5 mL (has no administration in time range)  HYDROcodone-acetaminophen (NORCO/VICODIN) 5-325 MG per tablet 1 tablet (1 tablet Oral Given 02/03/24 2254)     IMPRESSION / MDM / ASSESSMENT AND PLAN / ED COURSE  I reviewed the triage vital signs and the nursing notes.  Differential diagnosis includes, but is not limited to, abscess, cyst, cellulitis  Patient's presentation is most consistent with acute, uncomplicated illness.   Patient's diagnosis is consistent with left groin abscess.  I did review the patient's allergies and  medications.  During admission patient received pain medicine, and drainage of the abscess with about 20 mL of purulent secretion. patient will be discharged home with prescriptions for doxycycline and ibuprofen. Patient is to follow up with PCP as needed or otherwise directed. Patient is given ED precautions to return to the ED for any worsening or new symptoms. Discussed plan of care with patient, answered all of patient's questions, Patient agreeable to plan of care. Advised patient to take medications according to the instructions on the label. Discussed possible side effects of new medications. Patient verbalized understanding.     FINAL CLINICAL IMPRESSION(S) / ED DIAGNOSES   Final diagnoses:  Abscess     Rx / DC Orders   ED Discharge Orders          Ordered    doxycycline (VIBRAMYCIN) 50 MG capsule  2 times daily        02/03/24 2353    ibuprofen (ADVIL) 600 MG tablet  Every 8 hours PRN        02/03/24 2353             Note:  This document was prepared using Dragon voice recognition software and may include unintentional dictation errors.   Gladys Damme, PA-C 02/03/24 2355    Gladys Damme, PA-C 02/03/24 2355    Minna Antis, MD 02/04/24 2248

## 2024-02-03 NOTE — ED Notes (Addendum)
 Molly Salinas

## 2024-04-11 ENCOUNTER — Ambulatory Visit
Admission: EM | Admit: 2024-04-11 | Discharge: 2024-04-11 | Disposition: A | Discharge status: Home / Self Care | Attending: Emergency Medicine | Admitting: Emergency Medicine

## 2024-04-11 ENCOUNTER — Ambulatory Visit (INDEPENDENT_AMBULATORY_CARE_PROVIDER_SITE_OTHER)

## 2024-04-11 DIAGNOSIS — R051 Acute cough: Secondary | ICD-10-CM

## 2024-04-11 DIAGNOSIS — I10 Essential (primary) hypertension: Secondary | ICD-10-CM | POA: Insufficient documentation

## 2024-04-11 DIAGNOSIS — J22 Unspecified acute lower respiratory infection: Secondary | ICD-10-CM | POA: Diagnosis present

## 2024-04-11 DIAGNOSIS — E119 Type 2 diabetes mellitus without complications: Secondary | ICD-10-CM | POA: Diagnosis present

## 2024-04-11 LAB — URINALYSIS, ROUTINE W REFLEX MICROSCOPIC
Bilirubin Urine: NEGATIVE
Glucose, UA: 500 mg/dL — AB
Hgb urine dipstick: NEGATIVE
Leukocytes,Ua: NEGATIVE
Nitrite: NEGATIVE
Protein, ur: 100 mg/dL — AB
Specific Gravity, Urine: 1.02 (ref 1.005–1.030)
pH: 7 (ref 5.0–8.0)

## 2024-04-11 LAB — CBC WITH DIFFERENTIAL/PLATELET
Abs Immature Granulocytes: 0.02 10*3/uL (ref 0.00–0.07)
Basophils Absolute: 0 10*3/uL (ref 0.0–0.1)
Basophils Relative: 1 %
Eosinophils Absolute: 0.3 10*3/uL (ref 0.0–0.5)
Eosinophils Relative: 5 %
HCT: 39.1 % (ref 36.0–46.0)
Hemoglobin: 13.1 g/dL (ref 12.0–15.0)
Immature Granulocytes: 0 %
Lymphocytes Relative: 33 %
Lymphs Abs: 2.2 10*3/uL (ref 0.7–4.0)
MCH: 29.4 pg (ref 26.0–34.0)
MCHC: 33.5 g/dL (ref 30.0–36.0)
MCV: 87.9 fL (ref 80.0–100.0)
Monocytes Absolute: 0.7 10*3/uL (ref 0.1–1.0)
Monocytes Relative: 10 %
Neutro Abs: 3.5 10*3/uL (ref 1.7–7.7)
Neutrophils Relative %: 51 %
Platelets: 156 10*3/uL (ref 150–400)
RBC: 4.45 MIL/uL (ref 3.87–5.11)
RDW: 13.2 % (ref 11.5–15.5)
WBC: 6.7 10*3/uL (ref 4.0–10.5)
nRBC: 0 % (ref 0.0–0.2)

## 2024-04-11 LAB — COMPREHENSIVE METABOLIC PANEL WITH GFR
ALT: 38 U/L (ref 0–44)
AST: 40 U/L (ref 15–41)
Albumin: 4 g/dL (ref 3.5–5.0)
Alkaline Phosphatase: 133 U/L — ABNORMAL HIGH (ref 38–126)
Anion gap: 11 (ref 5–15)
BUN: 13 mg/dL (ref 6–20)
CO2: 27 mmol/L (ref 22–32)
Calcium: 9.2 mg/dL (ref 8.9–10.3)
Chloride: 97 mmol/L — ABNORMAL LOW (ref 98–111)
Creatinine, Ser: 0.77 mg/dL (ref 0.44–1.00)
GFR, Estimated: >60 mL/min (ref >60)
Glucose, Bld: 196 mg/dL — ABNORMAL HIGH (ref 70–99)
Potassium: 3.6 mmol/L (ref 3.5–5.1)
Sodium: 135 mmol/L (ref 135–145)
Total Bilirubin: 0.5 mg/dL (ref 0.0–1.2)
Total Protein: 8.5 g/dL — ABNORMAL HIGH (ref 6.5–8.1)

## 2024-04-11 LAB — URINALYSIS, MICROSCOPIC (REFLEX)

## 2024-04-11 MED ORDER — DOXYCYCLINE HYCLATE 100 MG PO CAPS: 100.0000 mg | ORAL_CAPSULE | Freq: Two times a day (BID) | ORAL | 0 refills | Status: AC

## 2024-04-11 MED ORDER — ALBUTEROL SULFATE HFA 108 (90 BASE) MCG/ACT IN AERS: 1.0000 | INHALATION_SPRAY | RESPIRATORY_TRACT | 0 refills | Status: AC | PRN

## 2024-04-11 MED ORDER — AMLODIPINE BESYLATE 5 MG PO TABS
5.0000 mg | ORAL_TABLET | Freq: Every day | ORAL | 2 refills | Status: DC
Start: 2024-04-11 — End: 2024-06-22

## 2024-04-11 MED ORDER — METFORMIN HCL 500 MG PO TABS
500.0000 mg | ORAL_TABLET | Freq: Two times a day (BID) | ORAL | 2 refills | Status: DC
Start: 2024-04-11 — End: 2024-06-12

## 2024-04-11 MED ORDER — PROMETHAZINE-DM 6.25-15 MG/5ML PO SYRP: 5.0000 mL | ORAL_SOLUTION | Freq: Four times a day (QID) | ORAL | 0 refills | Status: AC | PRN

## 2024-04-11 MED ORDER — AEROCHAMBER MV MISC: 1 refills | Status: DC

## 2024-04-11 NOTE — ED Provider Notes (Signed)
 HPI  SUBJECTIVE:  Molly Salinas is a 53 y.o. female who presents with 2 weeks of a cough productive of mucus.  It started with nasal congestion, rhinorrhea, but this has resolved.  She reports wheezing and rib soreness secondary to cough.  No fevers, sinus pain or pressure, postnasal drip, other chest pain, shortness of breath, dyspnea on exertion.  No GERD symptoms.  No lower extremity edema, nocturia, unintentional weight gain, PND, orthopnea, abdominal pain.  She is waking up coughing.  No antibiotics in the past 3 months.  No antipyretic in the past 6 hours.  She tried Mucinex, Alka-Seltzer, honey/onion, tea, cough drops without improvement in her symptoms.  Symptoms are worse with lying down.  She has a past medical history of diabetes last hemoglobin A1c in November 2024 11.9, hypertension, sarcoidosis, GERD, smoker.  No history of pulmonary disease.  States that she has not taken her diabetic or hypertensive medications in months.  States that she cannot take lisinopril due to cough/headache.  She checks her glucose 3 times a day and states that it has been running in the low 100s to 120s.  She states her blood pressure at home has been running 170s over 90s.  She denies symptoms of hyperglycemia such as polyuria, polydipsia, unintentional weight loss.  Currently denies symptoms of hypertensive emergency such as headache, visual changes, face/arm/leg numbness/tingling/weakness, chest pain, shortness of breath, syncope, seizures, lower extremity edema, anuria, hematuria.  Blood pressure in the ED on 11/05/2023 198/114, on 2/20 212/117.  PCP: She plans to establish care with [facility] in August  Past Medical History:  Diagnosis Date   Diabetes mellitus without complication [facility])     Past Surgical History:  Procedure Laterality Date   ABDOMINAL HYSTERECTOMY      History reviewed. No pertinent family history.  Social History   Tobacco Use   Smoking status: Every Day   Smokeless  tobacco: Never  Vaping Use   Vaping status: Never Used  Substance Use Topics   Alcohol use: Yes    Alcohol/week: 2.0 standard drinks of alcohol    Types: 2 Glasses of wine per week    Comment: Once per week   Drug use: Never    No current facility-administered medications for this encounter.  Current Outpatient Medications:    albuterol (VENTOLIN HFA) 108 (90 Base) MCG/ACT inhaler, Inhale 1-2 puffs into the lungs every 4 (four) hours as needed for wheezing or shortness of breath., Disp: 1 each, Rfl: 0   amLODipine (NORVASC) 5 MG tablet, Take 1 tablet (5 mg total) by mouth daily., Disp: 30 tablet, Rfl: 2   doxycycline  (VIBRAMYCIN ) 100 MG capsule, Take 1 capsule (100 mg total) by mouth 2 (two) times daily for 5 days., Disp: 10 capsule, Rfl: 0   metFORMIN (GLUCOPHAGE) 500 MG tablet, Take 1 tablet (500 mg total) by mouth 2 (two) times daily with a meal., Disp: 60 tablet, Rfl: 2   promethazine-dextromethorphan (PROMETHAZINE-DM) 6.25-15 MG/5ML syrup, Take 5 mLs by mouth 4 (four) times daily as needed for cough., Disp: 118 mL, Rfl: 0   Spacer/Aero-Holding Chambers (AEROCHAMBER MV) inhaler, Use as instructed, Disp: 1 each, Rfl: 1  No Known Allergies   ROS  As noted in HPI.   Physical Exam  BP (!) 175/104 (BP Location: Left Arm)   Pulse (!) 105   Temp 99.6 F (37.6 C) (Oral)   Resp 16   Ht 5\' 4"  (1.626 m)   Wt 77.6 kg   SpO2 97%  BMI 29.35 kg/m    BP Readings from Last 3 Encounters:  04/11/24 (!) 175/104  02/04/24 (!) 168/95  11/01/23 (!) 157/114    Constitutional: Well developed, well nourished, no acute distress Eyes: PERRL, EOMI, conjunctiva normal bilaterally HENT: Normocephalic, atraumatic,mucus membranes moist.  No nasal congestion, sinus tenderness, postnasal drip Respiratory: Fair air movement, crackles right lower lobe Cardiovascular: Normal rate and rhythm, no murmurs, no gallops, no rubs GI: nondistended skin: No rash, skin intact Musculoskeletal: no  deformities.  No edema Neurologic: Alert & oriented x 3, CN III-XII grossly intact, no motor deficits, sensation grossly intact Psychiatric: Speech and behavior appropriate   ED Course   Medications - No data to display  Orders Placed This Encounter  Procedures   DG Chest 2 View    Standing Status:   Standing    Number of Occurrences:   1    Reason for Exam (SYMPTOM  OR DIAGNOSIS REQUIRED):   cough wheeze 2 weeks. crackles RLL.  h/o sarcoidosis, smoker. r/o PNA, pulm edema   Comprehensive metabolic panel    Standing Status:   Standing    Number of Occurrences:   1   CBC with Differential    Standing Status:   Standing    Number of Occurrences:   1   Urinalysis, Routine w reflex microscopic -Urine, Clean Catch    Standing Status:   Standing    Number of Occurrences:   1    Specimen Source:   Urine, Clean Catch [[id number]]   Urinalysis, Microscopic (reflex)    Standing Status:   Standing    Number of Occurrences:   1   Nursing Communication Please set up with PCP prior to discharge    Please set up with PCP prior to discharge    Standing Status:   Standing    Number of Occurrences:   1   Results for orders placed or performed during the hospital encounter of 04/11/24 (from the past 24 hours)  Comprehensive metabolic panel     Status: Abnormal   Collection Time: 04/11/24  5:06 PM  Result Value Ref Range   Sodium 135 135 - 145 mmol/L   Potassium 3.6 3.5 - 5.1 mmol/L   Chloride 97 (L) 98 - 111 mmol/L   CO2 27 22 - 32 mmol/L   Glucose, Bld 196 (H) 70 - 99 mg/dL   BUN 13 6 - 20 mg/dL   Creatinine, Ser 2.84 0.44 - 1.00 mg/dL   Calcium 9.2 8.9 - 13.2 mg/dL   Total Protein 8.5 (H) 6.5 - 8.1 g/dL   Albumin 4.0 3.5 - 5.0 g/dL   AST 40 15 - 41 U/L   ALT 38 0 - 44 U/L   Alkaline Phosphatase 133 (H) 38 - 126 U/L   Total Bilirubin 0.5 0.0 - 1.2 mg/dL   GFR, Estimated >44 >01 mL/min   Anion gap 11 5 - 15  CBC with Differential     Status: None   Collection Time: 04/11/24  5:06 PM   Result Value Ref Range   WBC 6.7 4.0 - 10.5 K/uL   RBC 4.45 3.87 - 5.11 MIL/uL   Hemoglobin 13.1 12.0 - 15.0 g/dL   HCT 02.7 25.3 - 66.4 %   MCV 87.9 80.0 - 100.0 fL   MCH 29.4 26.0 - 34.0 pg   MCHC 33.5 30.0 - 36.0 g/dL   RDW 40.3 47.4 - 25.9 %   Platelets 156 150 - 400 K/uL   nRBC 0.0 0.0 -  0.2 %   Neutrophils Relative % 51 %   Neutro Abs 3.5 1.7 - 7.7 K/uL   Lymphocytes Relative 33 %   Lymphs Abs 2.2 0.7 - 4.0 K/uL   Monocytes Relative 10 %   Monocytes Absolute 0.7 0.1 - 1.0 K/uL   Eosinophils Relative 5 %   Eosinophils Absolute 0.3 0.0 - 0.5 K/uL   Basophils Relative 1 %   Basophils Absolute 0.0 0.0 - 0.1 K/uL   Immature Granulocytes 0 %   Abs Immature Granulocytes 0.02 0.00 - 0.07 K/uL  Urinalysis, Routine w reflex microscopic -Urine, Clean Catch     Status: Abnormal   Collection Time: 04/11/24  5:06 PM  Result Value Ref Range   Color, Urine YELLOW YELLOW   APPearance HAZY (A) CLEAR   Specific Gravity, Urine 1.020 1.005 - 1.030   pH 7.0 5.0 - 8.0   Glucose, UA 500 (A) NEGATIVE mg/dL   Hgb urine dipstick NEGATIVE NEGATIVE   Bilirubin Urine NEGATIVE NEGATIVE   Ketones, ur TRACE (A) NEGATIVE mg/dL   Protein, ur 696 (A) NEGATIVE mg/dL   Nitrite NEGATIVE NEGATIVE   Leukocytes,Ua NEGATIVE NEGATIVE  Urinalysis, Microscopic (reflex)     Status: Abnormal   Collection Time: 04/11/24  5:06 PM  Result Value Ref Range   RBC / HPF 0-5 0 - 5 RBC/hpf   WBC, UA 0-5 0 - 5 WBC/hpf   Bacteria, UA FEW (A) NONE SEEN   Squamous Epithelial / HPF 0-5 0 - 5 /HPF   Amorphous Crystal PRESENT    DG Chest 2 View Result Date: 04/11/2024 CLINICAL DATA:  Cough and wheezing for the past 2 weeks. Right lower lobe crackles. Smoker. History of sarcoidosis. EXAM: CHEST - 2 VIEW COMPARISON:  09/17/2009 FINDINGS: Heart size near the upper limit of normal. Clear lungs with normal vascularity. Minimal peribronchial thickening. Cervical and thoracic spine degenerative changes. Cholecystectomy clips.  IMPRESSION: Minimal bronchitic changes. Electronically Signed   By: Catherin Closs M.D.   On: 04/11/2024 17:12    ED Clinical Impression  1. Acute cough   2. Type 2 diabetes mellitus without complication, without long-term current use of insulin (HCC)   3. Essential hypertension   4. Lower respiratory tract infection      ED Assessment/Plan     1.  Cough, wheezing.  Getting chest x-ray to evaluate for pneumonia.  In the differential is untreated sarcoidosis, pulmonary edema due to uncontrolled hypertension, COPD, GERD.  Reviewed imaging independently.  Minimal bronchitic changes. See radiology report for full details.  X-ray shows minimal bronchitic changes.  No pneumonia.  Will send home with an albuterol inhaler with a spacer as I suspect she has a component of COPD due to the history of smoking.  I am  concerned about lower respiratory tract infection.  Deferring steroid burst as last time she got this she became significantly hyperglycemic in the high 500s.  Promethazine DM for cough.  Will send home with doxycycline  100 mg p.o. twice daily for 5 days given duration of symptoms.  2.  Uncontrolled diabetes.  Has no signs or symptoms of hyperglycemia, but she is out of metformin.  Has been attempting to control diabetes through diet.  Will check CMP, CBC UA.   Labs reviewed.  Glucose 196, slowly elevated alkaline phosphatase, however, normal AST/ALT, otherwise on remarkable CBC, CMP.  She has glucosurea, trace ketones and some proteinuria which would be consistent with uncontrolled diabetes and/for hypertension.  She has a few bacteria in her urine,  but has no urinary symptoms.  Will not treat this. Plan to restart metformin 500 mg twice daily, have her monitor her sugars.  If they remain persistently elevated above 200, will have her come back in and we can make adjustments to her metformin.  Otherwise, we will leave titration and strict glycemic control up to PCP.   3.  Uncontrolled  hypertension.  Patient has no signs or symptoms of hypertensive emergency.  Plan to start her on amlodipine 5 mg daily, have her monitor her blood pressure.  Will have her return in 2 weeks if her blood pressure remains persistently elevated above 140/90, and we will adjust her blood pressure medication  Discussed labs, imaging, MDM, treatment plan, and plan for follow-up with patient Discussed sn/sx that should prompt return to the ED. patient agrees with plan.   Spent 45 minutes in the care of this patient.  Meds ordered this encounter  Medications   amLODipine (NORVASC) 5 MG tablet    Sig: Take 1 tablet (5 mg total) by mouth daily.    Dispense:  30 tablet    Refill:  2   metFORMIN (GLUCOPHAGE) 500 MG tablet    Sig: Take 1 tablet (500 mg total) by mouth 2 (two) times daily with a meal.    Dispense:  60 tablet    Refill:  2   albuterol (VENTOLIN HFA) 108 (90 Base) MCG/ACT inhaler    Sig: Inhale 1-2 puffs into the lungs every 4 (four) hours as needed for wheezing or shortness of breath.    Dispense:  1 each    Refill:  0   Spacer/Aero-Holding Chambers (AEROCHAMBER MV) inhaler    Sig: Use as instructed    Dispense:  1 each    Refill:  1   promethazine-dextromethorphan (PROMETHAZINE-DM) 6.25-15 MG/5ML syrup    Sig: Take 5 mLs by mouth 4 (four) times daily as needed for cough.    Dispense:  118 mL    Refill:  0   doxycycline  (VIBRAMYCIN ) 100 MG capsule    Sig: Take 1 capsule (100 mg total) by mouth 2 (two) times daily for 5 days.    Dispense:  10 capsule    Refill:  0      *This clinic note was created using Scientist, clinical (histocompatibility and immunogenetics). Therefore, there may be occasional mistakes despite careful proofreading. ?    Ethlyn Herd, MD 04/11/24 1755

## 2024-04-11 NOTE — Discharge Instructions (Addendum)
 Try calling [facility] to see if you can be seen sooner than August.  Your x-ray was negative for pneumonia.  You have mild bronchitis on x-ray, which can be treated with albuterol, but I am concerned that you have a lower respiratory tract infection.  I am deferring steroids at this time as I do not want to make you hyperglycemic again.  Promethazine DM for cough.  Finish the doxycycline , even if you feel better.  Take two puffs from your albuterol inhaler with your spacer every 4 hours for 2 days, then every 6 hours for 2 days, then as needed. You can back off if you start to improve  sooner.  Finish the antibiotics, even if you feel better. Take tylenol  1 gram combined with  600 mg of motrin  up to 3-4 times a day as needed for pain. Make sure you drink extra fluids. Return to the ER if you get worse, have a fever >100.4, or any other concerns.   If the spacer is too expensive at the pharmacy, you can get an AeroChamber Z-Stat off of [employer] for about $10-$15.  I am starting you on metformin today for your diabetes.  Your glucose here today was 196.  Keep a log of your sugar, and if it remains persistently elevated above 200, return here so that we can adjust your metformin.  I am also starting you on amlodipine for your blood pressure. Decrease your salt intake. diet and exercise will lower your blood pressure significantly. It is important to keep your blood pressure under good control, as having a elevated blood pressure for prolonged periods of time significantly increases your risk of stroke, heart attacks, kidney damage, eye damage, and other problems. Get a validated blood pressure cuff that goes on your arm, not your wrist.  Measure your blood pressure once a day, preferably at the same time every day. Keep a log of this and bring it to your next doctor's appointment.  Bring your blood pressure cuff as well.  Return here in 2 weeks for blood pressure recheck if you're unable to get into see primary care  physician by then. Return immediately to the ER if you start having chest pain, headache, problems seeing, problems talking, problems walking, if you feel like you're about to pass out, if you do pass out, if you have a seizure, or for any other concerns.  Go to www.goodrx.com  or www.costplusdrugs.com to look up your medications. This will give you a list of where you can find your prescriptions at the most affordable prices. Or ask the pharmacist what the cash price is, or if they have any other discount programs available to help make your medication more affordable. This can be less expensive than what you would pay with insurance.

## 2024-04-11 NOTE — ED Triage Notes (Signed)
 Pt c/o cough & wheezing x2 wks. Has tried mucinex & cough drops w/o relief. Denies any hx of asthma or COPD.

## 2024-06-09 ENCOUNTER — Ambulatory Visit (INDEPENDENT_AMBULATORY_CARE_PROVIDER_SITE_OTHER): Admitting: Pediatrics

## 2024-06-09 ENCOUNTER — Encounter: Payer: Self-pay | Admitting: Pediatrics

## 2024-06-09 ENCOUNTER — Telehealth: Payer: Self-pay | Admitting: Pediatrics

## 2024-06-09 VITALS — BP 152/85 | HR 83 | Temp 98.8°F | Ht 63.0 in | Wt 170.0 lb

## 2024-06-09 DIAGNOSIS — E1142 Type 2 diabetes mellitus with diabetic polyneuropathy: Secondary | ICD-10-CM | POA: Diagnosis not present

## 2024-06-09 DIAGNOSIS — E119 Type 2 diabetes mellitus without complications: Secondary | ICD-10-CM | POA: Diagnosis not present

## 2024-06-09 DIAGNOSIS — Z1330 Encounter for screening examination for mental health and behavioral disorders, unspecified: Secondary | ICD-10-CM | POA: Diagnosis not present

## 2024-06-09 DIAGNOSIS — Z7689 Persons encountering health services in other specified circumstances: Secondary | ICD-10-CM

## 2024-06-09 DIAGNOSIS — E785 Hyperlipidemia, unspecified: Secondary | ICD-10-CM

## 2024-06-09 DIAGNOSIS — I10 Essential (primary) hypertension: Secondary | ICD-10-CM | POA: Diagnosis not present

## 2024-06-09 DIAGNOSIS — E1169 Type 2 diabetes mellitus with other specified complication: Secondary | ICD-10-CM | POA: Diagnosis not present

## 2024-06-09 LAB — MICROALBUMIN, URINE WAIVED
Creatinine, Urine Waived: 50 mg/dL (ref 10–300)
Microalb, Ur Waived: 80 mg/L — ABNORMAL HIGH (ref 0–19)
Microalb/Creat Ratio: >300 mg/g — ABNORMAL HIGH (ref <30)

## 2024-06-09 LAB — URINALYSIS, ROUTINE W REFLEX MICROSCOPIC
Bilirubin, UA: NEGATIVE
Glucose, UA: NEGATIVE
Ketones, UA: NEGATIVE
Leukocytes,UA: NEGATIVE
Nitrite, UA: NEGATIVE
Protein,UA: NEGATIVE
RBC, UA: NEGATIVE
Specific Gravity, UA: 1.01 (ref 1.005–1.030)
Urobilinogen, Ur: 0.2 mg/dL (ref 0.2–1.0)
pH, UA: 6.5 (ref 5.0–7.5)

## 2024-06-09 MED ORDER — SEMAGLUTIDE(0.25 OR 0.5MG/DOS) 2 MG/3ML ~~LOC~~ SOPN
0.5000 mg | PEN_INJECTOR | SUBCUTANEOUS | 0 refills | Status: AC
Start: 2024-06-09 — End: ?

## 2024-06-09 MED ORDER — GABAPENTIN 300 MG PO CAPS: 300.0000 mg | ORAL_CAPSULE | Freq: Every day | ORAL | 3 refills | Status: AC

## 2024-06-09 MED ORDER — OLMESARTAN MEDOXOMIL 20 MG PO TABS: 20.0000 mg | ORAL_TABLET | Freq: Every day | ORAL | 2 refills | Status: DC

## 2024-06-09 MED ORDER — ATORVASTATIN CALCIUM 10 MG PO TABS
10.0000 mg | ORAL_TABLET | Freq: Every day | ORAL | 3 refills | Status: AC
Start: 2024-06-09 — End: ?

## 2024-06-09 NOTE — Telephone Encounter (Signed)
 Copied from CRM [id number]. Topic: Clinical - Medication Question >> Jun 09, 2024  2:16 PM Delon HERO wrote: Reason for CRM: Pharmacy is calling to report this is the first script for Semaglutide,0.25 or 0.5MG /DOS, 2 MG/3ML SOPN [[id number]].Requesting new sig at .25 for the patient

## 2024-06-09 NOTE — Progress Notes (Unsigned)
   Establish Care Note  BP (!) 152/85   Pulse 83   Temp 98.8 F (37.1 C) (Oral)   Ht 5' 3 (1.6 m)   Wt 170 lb (77.1 kg)   SpO2 99%   BMI 30.11 kg/m    Subjective:    Patient ID: Molly Salinas, female    DOB: 1971-11-13, 53 y.o.   MRN: [id number]  HPI: Molly Salinas is a 53 y.o. female  Chief Complaint  Patient presents with   Establish Care    Diabetes and high blood pressure     Establishing care, the following was discussed today:  Discussed the use of AI scribe software for clinical note transcription with the patient, who gave verbal consent to proceed.  History of Present Illness     Current Outpatient Medications on File Prior to Visit  Medication Sig Dispense Refill   amLODipine  (NORVASC ) 5 MG tablet Take 1 tablet (5 mg total) by mouth daily. 30 tablet 2   metFORMIN  (GLUCOPHAGE ) 500 MG tablet Take 1 tablet (500 mg total) by mouth 2 (two) times daily with a meal. 60 tablet 2   albuterol  (VENTOLIN  HFA) 108 (90 Base) MCG/ACT inhaler Inhale 1-2 puffs into the lungs every 4 (four) hours as needed for wheezing or shortness of breath. (Patient not taking: Reported on 06/09/2024) 1 each 0   promethazine -dextromethorphan (PROMETHAZINE -DM) 6.25-15 MG/5ML syrup Take 5 mLs by mouth 4 (four) times daily as needed for cough. 118 mL 0   Spacer/Aero-Holding Chambers (AEROCHAMBER MV) inhaler Use as instructed (Patient not taking: Reported on 06/09/2024) 1 each 1   No current facility-administered medications on file prior to visit.    #HM Will review [facility] records and updated as needed.  Relevant past medical, surgical, family and social history reviewed and updated as indicated. Interim medical history since our last visit reviewed. Allergies and medications reviewed and updated.  ROS per HPI unless specifically indicated above     Objective:    BP (!) 152/85   Pulse 83   Temp 98.8 F (37.1 C) (Oral)   Ht 5' 3 (1.6 m)   Wt 170 lb (77.1 kg)   SpO2 99%   BMI  30.11 kg/m   Wt Readings from Last 3 Encounters:  06/09/24 170 lb (77.1 kg)  04/11/24 171 lb (77.6 kg)  02/03/24 171 lb (77.6 kg)     Physical Exam       No data to display               No data to display             Assessment & Plan:  Assessment & Plan   Essential hypertension -     Olmesartan Medoxomil; Take 1 tablet (20 mg total) by mouth daily.  Dispense: 30 tablet; Refill: 2 -     Comprehensive metabolic panel with GFR  Type 2 diabetes mellitus without complication, without long-term current use of insulin (HCC) -     Hemoglobin A1c -     Ambulatory referral to Ophthalmology -     Lipid panel -     Urinalysis, Routine w reflex microscopic -     Microalbumin, Urine Waived    Assessment and Plan Assessment & Plan        Follow up plan: No follow-ups on file.  Hadassah SHAUNNA Nett, MD

## 2024-06-09 NOTE — Patient Instructions (Addendum)
 Next visit 7/10 at 11am with Dr. Herold  Plan for today: For you blood pressure: - continue amlodipine  5mg  daily - we are adding olmesartan 20mg  daily with food - eventually we may be able to combine these two medications  For your diabetes: - I placed a referral for the eye doctor - yearly things: urine protein  - we are starting a cholesterol medication again: atorvastatin 10mg  - continue metformin  500mg  daily - start ozempic 0.25mg   The schedule for Ozempic is: 0.25mg  for 4 weeks 0.5mg  for 4 weeks 1mg  for 4 weeks 2mg  goal dose  The most common adverse reactions, reported in >=5% of patients treated with Ozempic are nausea, vomiting, diarrhea, abdominal pain, and constipation. These typically resolve over time but please let me know if they do not resolve.  How to use pen Text PEN to [phone] to get a link to the video to learn how to use the Ozempic pen.  Get savings information Eligible patients pay as little as $25a for a 29-month, 85-month, or 72-month prescription by texting BEGIN to [phone] or registering on [email or web address]  For instructional videos and more information about ozempic go here: [email or web address].[email or web address]

## 2024-06-10 LAB — HEMOGLOBIN A1C
Est. average glucose Bld gHb Est-mCnc: 194 mg/dL
Hgb A1c MFr Bld: 8.4 % — ABNORMAL HIGH (ref 4.8–5.6)

## 2024-06-10 LAB — COMPREHENSIVE METABOLIC PANEL WITH GFR
ALT: 29 IU/L (ref 0–32)
AST: 38 IU/L (ref 0–40)
Albumin: 4.6 g/dL (ref 3.8–4.9)
Alkaline Phosphatase: 138 IU/L — ABNORMAL HIGH (ref 44–121)
BUN/Creatinine Ratio: 17 (ref 9–23)
BUN: 15 mg/dL (ref 6–24)
Bilirubin Total: 0.5 mg/dL (ref 0.0–1.2)
CO2: 18 mmol/L — ABNORMAL LOW (ref 20–29)
Calcium: 9.8 mg/dL (ref 8.7–10.2)
Chloride: 97 mmol/L (ref 96–106)
Creatinine, Ser: 0.86 mg/dL (ref 0.57–1.00)
Globulin, Total: 3.9 g/dL (ref 1.5–4.5)
Glucose: 141 mg/dL — ABNORMAL HIGH (ref 70–99)
Sodium: 135 mmol/L (ref 134–144)
Total Protein: 8.5 g/dL (ref 6.0–8.5)
eGFR: 80 mL/min/{1.73_m2} (ref >59)

## 2024-06-10 LAB — LIPID PANEL
Chol/HDL Ratio: 3.2 ratio (ref 0.0–4.4)
Cholesterol, Total: 229 mg/dL — ABNORMAL HIGH (ref 100–199)
HDL: 72 mg/dL (ref >39)
LDL Chol Calc (NIH): 142 mg/dL — ABNORMAL HIGH (ref 0–99)
Triglycerides: 87 mg/dL (ref 0–149)
VLDL Cholesterol Cal: 15 mg/dL (ref 5–40)

## 2024-06-11 ENCOUNTER — Encounter: Payer: Self-pay | Admitting: Pediatrics

## 2024-06-11 NOTE — Assessment & Plan Note (Signed)
 Not on cholesterol-lowering medication. Statin recommended due to diabetes for cardiovascular risk reduction. - Prescribe statin. - Check baseline cholesterol today.

## 2024-06-11 NOTE — Assessment & Plan Note (Signed)
 Not well-controlled, BP above 140/80 mmHg. Amlodipine  causes headaches, taken at night. Add olmesartan for better control. Goal BP under 130/80 mmHg due to diabetes. - Prescribe olmesartan 20mg  with amlodipine . - Monitor BP for under 130/80 mmHg. - Report side effects such as headaches.

## 2024-06-11 NOTE — Assessment & Plan Note (Addendum)
 Poorly controlled with A1c of 11.5%. Limited metformin  use due to gastrointestinal side effects. Hyperglycemia symptoms present. 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 - Initiate Ozempic 0.25 mg weekly, increase as tolerated. - Provide Ozempic sample for one month. - Check A1c today. - Consider discontinuing metformin  if Ozempic effective. - Refer to ophthalmologist for diabetic retinal exam. - Recommend Sarna cream for neuropathy. - Check urine for protein.

## 2024-06-11 NOTE — Assessment & Plan Note (Signed)
 Chronic and worsening. Most uncomfortable at night.  - Prescribe gabapentin 300 mg nightly for neuropathy.

## 2024-06-12 ENCOUNTER — Ambulatory Visit: Payer: Self-pay | Admitting: Pediatrics

## 2024-06-12 NOTE — Telephone Encounter (Signed)
 Spoke with pharmacy confirmed that it was the correct dose

## 2024-06-13 ENCOUNTER — Telehealth: Payer: Self-pay

## 2024-06-13 ENCOUNTER — Other Ambulatory Visit: Payer: Self-pay | Admitting: Pediatrics

## 2024-06-13 DIAGNOSIS — E119 Type 2 diabetes mellitus without complications: Secondary | ICD-10-CM

## 2024-06-13 DIAGNOSIS — R809 Proteinuria, unspecified: Secondary | ICD-10-CM

## 2024-06-13 MED ORDER — EMPAGLIFLOZIN 10 MG PO TABS: 10.0000 mg | ORAL_TABLET | Freq: Every day | ORAL | 3 refills | Status: AC

## 2024-06-13 NOTE — Progress Notes (Signed)
 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  Hadassah SHAUNNA Nett, MD

## 2024-06-13 NOTE — Telephone Encounter (Signed)
 Copied from CRM [id number]. Topic: Clinical - Medical Advice >> Jun 13, 2024  8:59 AM Wess RAMAN wrote: Reason for CRM: Patient would like to know if she should start medication now or wait.  From Dr. Herold: I recommend starting a medication called jardiance to keep kidneys protected from any further damage. This medication will also help with blood sugars.   Please let me know if she is ok with me sending this now or if she wants to wait until our follow up appointment  to discuss more that's fine.   Callback #: [phone]

## 2024-06-13 NOTE — Telephone Encounter (Signed)
 Called and notified patient of Dr. Carie Caddy message.

## 2024-06-14 ENCOUNTER — Telehealth: Payer: Self-pay

## 2024-06-14 NOTE — Telephone Encounter (Signed)
 Pharmacy Patient Advocate Encounter   Received notification from CoverMyMeds that prior authorization for Ozempic (0.25 or 0.5 MG/DOSE) 2MG /3ML pen-injectors is required/requested.   Insurance verification completed.   The patient is insured through [employer] .   Per test claim: PA required; PA submitted to above mentioned insurance via CoverMyMeds Key/confirmation #/EOC [facility] Status is pending

## 2024-06-19 ENCOUNTER — Telehealth: Payer: Self-pay

## 2024-06-19 NOTE — Telephone Encounter (Signed)
 Pharmacy Patient Advocate Encounter   Received notification from CoverMyMeds that prior authorization for Jardiance  10MG  tablets is required/requested.   Insurance verification completed.   The patient is insured through [employer] .   Per test claim: PA required; PA submitted to above mentioned insurance via CoverMyMeds Key/confirmation #/[id number] Status is pending

## 2024-06-19 NOTE — Telephone Encounter (Signed)
 Pharmacy Patient Advocate Encounter  Received notification from [employer] that Prior Authorization for Ozempic  has been APPROVED from 06/14/2024 to 06/14/2025 PA #/Case ID/Reference #: [id number]

## 2024-06-19 NOTE — Telephone Encounter (Signed)
 Pharmacy Patient Advocate Encounter  Received notification from [employer] that Prior Authorization for Jardiance  has been APPROVED from 06/05/2024 to 06/19/2025   PA #/Case ID/Reference #: [id number]

## 2024-06-22 ENCOUNTER — Ambulatory Visit (INDEPENDENT_AMBULATORY_CARE_PROVIDER_SITE_OTHER): Admitting: Pediatrics

## 2024-06-22 ENCOUNTER — Encounter: Payer: Self-pay | Admitting: Pediatrics

## 2024-06-22 VITALS — BP 123/78 | HR 78 | Temp 98.6°F | Resp 15 | Ht 62.99 in | Wt 168.8 lb

## 2024-06-22 DIAGNOSIS — I10 Essential (primary) hypertension: Secondary | ICD-10-CM | POA: Diagnosis not present

## 2024-06-22 DIAGNOSIS — E119 Type 2 diabetes mellitus without complications: Secondary | ICD-10-CM | POA: Diagnosis not present

## 2024-06-22 MED ORDER — SEMAGLUTIDE (1 MG/DOSE) 4 MG/3ML ~~LOC~~ SOPN: 1.0000 mg | PEN_INJECTOR | SUBCUTANEOUS | 0 refills | Status: AC

## 2024-06-22 MED ORDER — AMLODIPINE-OLMESARTAN 5-20 MG PO TABS: 1.0000 | ORAL_TABLET | Freq: Every day | ORAL | 3 refills | Status: AC

## 2024-06-22 MED ORDER — SEMAGLUTIDE (2 MG/DOSE) 8 MG/3ML ~~LOC~~ SOPN: 2.0000 mg | PEN_INJECTOR | SUBCUTANEOUS | 6 refills | Status: AC

## 2024-06-22 NOTE — Assessment & Plan Note (Signed)
 Hypertension is controlled with olmesartan  and amlodipine , and blood pressure normalized after toothache resolution. Consider combining olmesartan  and amlodipine  into a single medication if insurance covers it. Send refills for amlodipine . Monitor blood pressure and report if it remains elevated for medication adjustment.

## 2024-06-22 NOTE — Patient Instructions (Signed)
 The schedule for Ozempic  is: 0.25mg  for 4 weeks 0.5mg  for 4 weeks 1mg  for 4 weeks 2mg  goal dose  The most common adverse reactions, reported in >=5% of patients treated with Ozempic  are nausea, vomiting, diarrhea, abdominal pain, and constipation. These typically resolve over time but please let me know if they do not resolve.  How to use pen Text PEN to [phone] to get a link to the video to learn how to use the Ozempic  pen.  Get savings information Eligible patients pay as little as $25a for a 23-month, 81-month, or 4-month prescription by texting BEGIN to [phone] or registering on [email or web address]  For instructional videos and more information about ozempic  go here: https://www.ozempic .com/savings-and-resources/tools-and-resources.html

## 2024-06-22 NOTE — Progress Notes (Signed)
 Office Visit  BP 123/78 (BP Location: Left Arm, Patient Position: Sitting, Cuff Size: Normal)   Pulse 78   Temp 98.6 F (37 C) (Oral)   Resp 15   Ht 5' 2.99 (1.6 m)   Wt 168 lb 12.8 oz (76.6 kg)   SpO2 98%   BMI 29.91 kg/m    Subjective:    Patient ID: Calton JONETTA Molt, female    DOB: 1971/01/16, 53 y.o.   MRN: [id number]  HPI: KENNETH LAX is a 53 y.o. female  Chief Complaint  Patient presents with   Hypertension    No concerns   Diabetes    Has not started the Jardiance  yet but should be soon. Ozempic  started last week with no issues.     Discussed the use of AI scribe software for clinical note transcription with the patient, who gave verbal consent to proceed.  History of Present Illness   VERONIQUE WARGA is a 53 year old female with hypertension and type 2 diabetes who presents for medication management and follow-up.  She experienced an elevation in blood pressure yesterday due to a toothache, reaching approximately 170/90 mmHg, but it has since normalized as the pain subsided.  She is currently on Ozempic  at a dose of 0.25 mg and has not experienced any adverse effects such as abdominal pain, nausea, or diarrhea. She reports weight loss since starting the medication and is satisfied with her current experience, stating 'I never got sick'.  Her hemoglobin A1c levels were not as high as anticipated, which she finds reassuring. She is also taking Jardiance , which has been recently approved for her use.       Relevant past medical, surgical, family and social history reviewed and updated as indicated. Interim medical history since our last visit reviewed. Allergies and medications reviewed and updated.  ROS per HPI unless specifically indicated above     Objective:    BP 123/78 (BP Location: Left Arm, Patient Position: Sitting, Cuff Size: Normal)   Pulse 78   Temp 98.6 F (37 C) (Oral)   Resp 15   Ht 5' 2.99 (1.6 m)   Wt 168 lb 12.8 oz (76.6 kg)    SpO2 98%   BMI 29.91 kg/m   Wt Readings from Last 3 Encounters:  06/22/24 168 lb 12.8 oz (76.6 kg)  06/09/24 170 lb (77.1 kg)  04/11/24 171 lb (77.6 kg)     Physical Exam Constitutional:      Appearance: Normal appearance.  Pulmonary:     Effort: Pulmonary effort is normal.  Musculoskeletal:        General: Normal range of motion.  Skin:    Comments: Normal skin color  Neurological:     General: No focal deficit present.     Mental Status: She is alert. Mental status is at baseline.  Psychiatric:        Mood and Affect: Mood normal.        Behavior: Behavior normal.        Thought Content: Thought content normal.         06/22/2024   11:18 AM 06/09/2024    1:51 PM  Depression screen PHQ 2/9  Decreased Interest 0 0  Down, Depressed, Hopeless 0 0  PHQ - 2 Score 0 0  Altered sleeping  2  Tired, decreased energy  0  Change in appetite  0  Feeling bad or failure about yourself   0  Trouble concentrating  0  Moving slowly  or fidgety/restless  0  Suicidal thoughts  0  PHQ-9 Score  2  Difficult doing work/chores  Somewhat difficult       06/22/2024   11:18 AM 06/09/2024    1:51 PM  GAD 7 : Generalized Anxiety Score  Nervous, Anxious, on Edge 0 1  Control/stop worrying 0 0  Worry too much - different things 0 0  Trouble relaxing 0 0  Restless 0 0  Easily annoyed or irritable 0 0  Afraid - awful might happen 0 0  Total GAD 7 Score 0 1  Anxiety Difficulty  Not difficult at all       Assessment & Plan:  Assessment & Plan   Essential hypertension Assessment & Plan: Hypertension is controlled with olmesartan  and amlodipine , and blood pressure normalized after toothache resolution. Consider combining olmesartan  and amlodipine  into a single medication if insurance covers it. Send refills for amlodipine . Monitor blood pressure and report if it remains elevated for medication adjustment.  Orders: -     amLODIPine -Olmesartan ; Take 1 tablet by mouth daily.  Dispense:  90 tablet; Refill: 3 -     Basic metabolic panel with GFR  Type 2 diabetes mellitus without complication, without long-term current use of insulin (HCC) Assessment & Plan: Diabetes uncontrolled w last A1c 8, started on Ozempic  doing well. Pending initiation of Jardiance . No adverse effects are reported. Increase Ozempic  dose to 0.5 mg after three weeks, continue uptitrating as tolerated. Continue Jardiance  10mg  (recently approved) as prescribed. Repeat blood work in three months to monitor diabetes control.  Orders: -     Semaglutide  (1 MG/DOSE); Inject 1 mg as directed once a week.  Dispense: 3 mL; Refill: 0 -     Semaglutide  (2 MG/DOSE); Inject 2 mg as directed once a week.  Dispense: 3 mL; Refill: 6    Follow up plan: Return in about 3 months (around 09/22/2024) for DM, HTN.  Hadassah SHAUNNA Nett, MD

## 2024-06-22 NOTE — Assessment & Plan Note (Addendum)
 Diabetes uncontrolled w last A1c 8, started on Ozempic  doing well. Pending initiation of Jardiance . No adverse effects are reported. Increase Ozempic  dose to 0.5 mg after three weeks, continue uptitrating as tolerated. Continue Jardiance  10mg  (recently approved) as prescribed. Repeat blood work in three months to monitor diabetes control.

## 2024-06-23 LAB — BASIC METABOLIC PANEL WITH GFR
BUN/Creatinine Ratio: 12 (ref 9–23)
BUN: 12 mg/dL (ref 6–24)
CO2: 17 mmol/L — ABNORMAL LOW (ref 20–29)
Calcium: 10.1 mg/dL (ref 8.7–10.2)
Chloride: 102 mmol/L (ref 96–106)
Creatinine, Ser: 0.97 mg/dL (ref 0.57–1.00)
Glucose: 123 mg/dL — ABNORMAL HIGH (ref 70–99)
Potassium: 4.2 mmol/L (ref 3.5–5.2)
Sodium: 139 mmol/L (ref 134–144)
eGFR: 69 mL/min/1.73 (ref >59)

## 2024-06-23 LAB — HM DIABETES EYE EXAM

## 2024-06-26 ENCOUNTER — Ambulatory Visit: Payer: Self-pay | Admitting: Pediatrics

## 2024-09-22 ENCOUNTER — Ambulatory Visit: Admitting: Pediatrics

## 2024-09-26 ENCOUNTER — Ambulatory Visit: Admitting: Pediatrics
